# Patient Record
Sex: Female | Born: 1970 | Race: Black or African American | Hispanic: No | Marital: Single | Smoking: Current every day smoker
Health system: Southern US, Community
[De-identification: ages and names within clinical notes are randomized; demographics above are authoritative.]

## PROBLEM LIST (undated history)

## (undated) DIAGNOSIS — I10 Essential (primary) hypertension: Secondary | ICD-10-CM

---

## 2016-04-26 ENCOUNTER — Encounter (HOSPITAL_COMMUNITY): Payer: Self-pay | Admitting: Emergency Medicine

## 2016-04-26 DIAGNOSIS — R51 Headache: Secondary | ICD-10-CM | POA: Insufficient documentation

## 2016-04-26 DIAGNOSIS — I10 Essential (primary) hypertension: Secondary | ICD-10-CM | POA: Insufficient documentation

## 2016-04-26 DIAGNOSIS — F17210 Nicotine dependence, cigarettes, uncomplicated: Secondary | ICD-10-CM | POA: Insufficient documentation

## 2016-04-26 LAB — I-STAT CHEM 8, ED
BUN: 11 mg/dL (ref 6–20)
CALCIUM ION: 1.19 mmol/L (ref 1.15–1.40)
Chloride: 105 mmol/L (ref 101–111)
Creatinine, Ser: 0.9 mg/dL (ref 0.44–1.00)
Glucose, Bld: 88 mg/dL (ref 65–99)
HCT: 41 % (ref 36.0–46.0)
Hemoglobin: 13.9 g/dL (ref 12.0–15.0)
Potassium: 4.2 mmol/L (ref 3.5–5.1)
SODIUM: 139 mmol/L (ref 135–145)
TCO2: 22 mmol/L (ref 0–100)

## 2016-04-26 NOTE — ED Notes (Signed)
EDP notified of pt, VS, hx. No orders at this time.

## 2016-04-26 NOTE — ED Triage Notes (Signed)
Pt here with severe right sided headache and htn. Pt reports she is from [place] and has not been on her BP medication in 3 months. Pt sts she had to be hospitalized in the past for HTN. Pt a/o x 4, no neuro symptoms.

## 2016-04-27 ENCOUNTER — Emergency Department (HOSPITAL_COMMUNITY)
Admission: EM | Admit: 2016-04-27 | Discharge: 2016-04-27 | Disposition: A | Discharge status: Home / Self Care | Payer: Self-pay | Attending: Emergency Medicine | Admitting: Emergency Medicine

## 2016-04-27 DIAGNOSIS — R519 Headache, unspecified: Secondary | ICD-10-CM

## 2016-04-27 DIAGNOSIS — R51 Headache: Secondary | ICD-10-CM

## 2016-04-27 DIAGNOSIS — I10 Essential (primary) hypertension: Secondary | ICD-10-CM

## 2016-04-27 HISTORY — DX: Essential (primary) hypertension: I10

## 2016-04-27 MED ORDER — SODIUM CHLORIDE 0.9 % IV SOLN
1000.0000 mL | INTRAVENOUS | Status: DC
  Administered 2016-04-27: 1000 mL via INTRAVENOUS

## 2016-04-27 MED ORDER — HYDROCHLOROTHIAZIDE 25 MG PO TABS: 25.0000 mg | ORAL_TABLET | Freq: Every day | ORAL | Status: DC

## 2016-04-27 MED ORDER — MORPHINE SULFATE (PF) 4 MG/ML IV SOLN
4.0000 mg | Freq: Once | INTRAVENOUS | Status: AC
Start: 2016-04-27 — End: 2016-04-27
  Administered 2016-04-27: 4 mg via INTRAVENOUS
  Filled 2016-04-27: qty 1

## 2016-04-27 MED ORDER — LABETALOL HCL 5 MG/ML IV SOLN
20.0000 mg | Freq: Once | INTRAVENOUS | Status: AC
  Administered 2016-04-27: 20 mg via INTRAVENOUS
  Filled 2016-04-27: qty 4

## 2016-04-27 MED ORDER — KETOROLAC TROMETHAMINE 30 MG/ML IJ SOLN
30.0000 mg | Freq: Once | INTRAMUSCULAR | Status: AC
  Administered 2016-04-27: 30 mg via INTRAVENOUS
  Filled 2016-04-27: qty 1

## 2016-04-27 MED ORDER — HYDROCHLOROTHIAZIDE 25 MG PO TABS: 25.0000 mg | ORAL_TABLET | Freq: Every day | ORAL | 1 refills | Status: DC

## 2016-04-27 MED ORDER — SODIUM CHLORIDE 0.9 % IV SOLN
1000.0000 mL | Freq: Once | INTRAVENOUS | Status: AC
  Administered 2016-04-27: 1000 mL via INTRAVENOUS

## 2016-04-27 NOTE — ED Notes (Signed)
Pt arrives to [facility] at this time via [facility]. Pt states that she was on BP medications but she began "feeling better" and she "took" herself of the medications. Pt states that she has a left frontal headache rated 9/10 at this time.   Chief Complaint  Patient presents with  . Hypertension  . Headache   Past Medical History:  Diagnosis Date  . Hypertension

## 2016-04-27 NOTE — ED Notes (Signed)
Pt provided with d/c instructions at this time. Pt verbalizes understanding of d/c instructions as well as follow up procedure after d/c.  Pt provided with RX for HCTZ.  Pt verbalizes understanding of RX directions. Pt in no apparent distress at this time.  Pt ambulatory at time of d/c.

## 2016-04-27 NOTE — ED Provider Notes (Signed)
[facility] Provider Note   CSN: [id number] Arrival date & time: 04/26/16  2051  By signing my name below, I, Hannah Henry. Hannah Henry, attest that this documentation has been prepared under the direction and in the presence of Hannah Bilis, MD.  Electronically Signed: Suzan Henry. Hannah Henry, ED Scribe. 04/27/16. 1:48 AM.    History   Chief Complaint Chief Complaint  Patient presents with  . Hypertension  . Headache   The history is provided by the patient. No language interpreter was used.    HPI Comments: Hannah Henry is a 45 y.o. female with a PMHx of HTN who presents to the Emergency Department complaining of constant, recurrent, unchanged HA onset this afternoon. Pt states HA came on after alcohol consumption. No aggravating or alleviating factors at this time. Denies any associated symptoms. No OTC medications or home remedies attempted prior to arrival. No recent fever, chills, nausea, vomiting, shortness of breath, or chest pain. Pt admits to a strong history of HAs without known cause. She recently relocated from [place], [place] 3 months ago and has been off of her prescribed blood pressure medication for approximately 1 year now.  PCP: No primary care provider on file.    Past Medical History:  Diagnosis Date  . Hypertension     There are no active problems to display for this patient.   History reviewed. No pertinent surgical history.  OB History    No data available       Home Medications    Prior to Admission medications   Not on File    Family History History reviewed. No pertinent family history.  Social History Social History  Substance Use Topics  . Smoking status: Current Every Day Smoker    Packs/day: 0.50    Types: Cigarettes  . Smokeless tobacco: Never Used  . Alcohol use Yes     Comment: daily- last drink 9/7 at 1700     Allergies   Review of patient's allergies indicates no known allergies.   Review of Systems Review of Systems    Constitutional: Negative for chills and fever.  Gastrointestinal: Negative for nausea and vomiting.  Neurological: Positive for headaches.  All other systems reviewed and are negative.    Physical Exam Updated Vital Signs BP (!) 204/141   Pulse 79   Temp 98.4 F (36.9 C)   Resp 18   Ht 5\' 2"  (1.575 m)   Wt 178 lb (80.7 kg)   LMP 04/19/2016 (Approximate)   SpO2 100%   BMI 32.56 kg/m   Physical Exam  Constitutional: She is oriented to person, place, and time. She appears well-developed and well-nourished. No distress.  HENT:  Head: Normocephalic and atraumatic.  Eyes: EOM are normal. Pupils are equal, round, and reactive to light.  Neck: Normal range of motion.  Cardiovascular: Normal rate, regular rhythm and normal heart sounds.   Pulmonary/Chest: Effort normal and breath sounds normal.  Abdominal: Soft. She exhibits no distension. There is no tenderness.  Musculoskeletal: Normal range of motion.  Neurological: She is alert and oriented to person, place, and time.  5/5 strength in major muscle groups of  bilateral upper and lower extremities. Speech normal. No facial asymetry.   Skin: Skin is warm and dry.  Psychiatric: She has a normal mood and affect. Judgment normal.  Nursing note and vitals reviewed.    ED Treatments / Results   DIAGNOSTIC STUDIES: Oxygen Saturation is 100% on RA, Normal by my interpretation.    COORDINATION OF CARE:  1:46 AM- Will give Hydrodiuril, Morphine,Toradol, and fluids.Will order blood work.  Discussed treatment plan with pt at bedside and pt agreed to plan.     Labs (all labs ordered are listed, but only abnormal results are displayed) Labs Reviewed  I-STAT CHEM 8, ED    EKG  EKG Interpretation None       Radiology No results found.  Procedures Procedures (including critical care time)  Medications Ordered in ED Medications - No data to display   Initial Impression / Assessment and Plan / ED Course  I have reviewed  the triage vital signs and the nursing notes.  Pertinent labs & imaging results that were available during my care of the patient were reviewed by me and considered in my medical decision making (see chart for details).  Clinical Course   3:57 AM Patient feels much better at this time.  Nonfocal neurologic exam.  Headache improved.  Blood pressure improved.  Patient will need follow-up with primary care physician.  Patient written a prescription for hydrochlorothiazide.  Final Clinical Impressions(s) / ED Diagnoses   Final diagnoses:  Essential hypertension  Headache, unspecified headache type    New Prescriptions New Prescriptions   HYDROCHLOROTHIAZIDE (HYDRODIURIL) 25 MG TABLET    Take 1 tablet (25 mg total) by mouth daily.   I personally performed the services described in this documentation, which was scribed in my presence. The recorded information has been reviewed and is accurate.        Hannah BilisKevin Janus Vlcek, MD 04/27/16 [phone]

## 2016-04-27 NOTE — ED Notes (Signed)
Family at bedside. 

## 2016-05-13 ENCOUNTER — Emergency Department (HOSPITAL_COMMUNITY): Payer: Self-pay

## 2016-05-13 ENCOUNTER — Encounter (HOSPITAL_COMMUNITY): Payer: Self-pay | Admitting: Emergency Medicine

## 2016-05-13 ENCOUNTER — Emergency Department (HOSPITAL_COMMUNITY)
Admission: EM | Admit: 2016-05-13 | Discharge: 2016-05-14 | Disposition: A | Discharge status: Home / Self Care | Payer: Self-pay | Attending: Emergency Medicine | Admitting: Emergency Medicine

## 2016-05-13 DIAGNOSIS — N926 Irregular menstruation, unspecified: Secondary | ICD-10-CM | POA: Insufficient documentation

## 2016-05-13 DIAGNOSIS — N910 Primary amenorrhea: Secondary | ICD-10-CM

## 2016-05-13 DIAGNOSIS — R109 Unspecified abdominal pain: Secondary | ICD-10-CM

## 2016-05-13 DIAGNOSIS — F17210 Nicotine dependence, cigarettes, uncomplicated: Secondary | ICD-10-CM | POA: Insufficient documentation

## 2016-05-13 DIAGNOSIS — G44219 Episodic tension-type headache, not intractable: Secondary | ICD-10-CM | POA: Insufficient documentation

## 2016-05-13 DIAGNOSIS — Z8679 Personal history of other diseases of the circulatory system: Secondary | ICD-10-CM | POA: Insufficient documentation

## 2016-05-13 DIAGNOSIS — R102 Pelvic and perineal pain: Secondary | ICD-10-CM | POA: Insufficient documentation

## 2016-05-13 DIAGNOSIS — I159 Secondary hypertension, unspecified: Secondary | ICD-10-CM | POA: Insufficient documentation

## 2016-05-13 LAB — CBC
HCT: 37.3 % (ref 36.0–46.0)
Hemoglobin: 12.2 g/dL (ref 12.0–15.0)
MCH: 27.1 pg (ref 26.0–34.0)
MCHC: 32.7 g/dL (ref 30.0–36.0)
MCV: 82.9 fL (ref 78.0–100.0)
Platelets: 352 K/uL (ref 150–400)
RBC: 4.5 MIL/uL (ref 3.87–5.11)
RDW: 12.6 % (ref 11.5–15.5)
WBC: 5 K/uL (ref 4.0–10.5)

## 2016-05-13 LAB — COMPREHENSIVE METABOLIC PANEL WITH GFR
ALT: 17 U/L (ref 14–54)
AST: 18 U/L (ref 15–41)
Albumin: 3.4 g/dL — ABNORMAL LOW (ref 3.5–5.0)
Alkaline Phosphatase: 60 U/L (ref 38–126)
Anion gap: 9 (ref 5–15)
BUN: 14 mg/dL (ref 6–20)
CO2: 19 mmol/L — ABNORMAL LOW (ref 22–32)
Calcium: 8.6 mg/dL — ABNORMAL LOW (ref 8.9–10.3)
Chloride: 105 mmol/L (ref 101–111)
Creatinine, Ser: 0.79 mg/dL (ref 0.44–1.00)
GFR calc Af Amer: >60 mL/min
GFR calc non Af Amer: >60 mL/min
Glucose, Bld: 84 mg/dL (ref 65–99)
Potassium: 3.8 mmol/L (ref 3.5–5.1)
Sodium: 133 mmol/L — ABNORMAL LOW (ref 135–145)
Total Bilirubin: 0.5 mg/dL (ref 0.3–1.2)
Total Protein: 6.8 g/dL (ref 6.5–8.1)

## 2016-05-13 LAB — URINALYSIS, ROUTINE W REFLEX MICROSCOPIC
BILIRUBIN URINE: NEGATIVE
GLUCOSE, UA: NEGATIVE mg/dL
Ketones, ur: NEGATIVE mg/dL
Nitrite: NEGATIVE
PH: 5 (ref 5.0–8.0)
Protein, ur: NEGATIVE mg/dL
SPECIFIC GRAVITY, URINE: 1.02 (ref 1.005–1.030)

## 2016-05-13 LAB — LIPASE, BLOOD: Lipase: 42 U/L (ref 11–51)

## 2016-05-13 LAB — URINE MICROSCOPIC-ADD ON

## 2016-05-13 LAB — I-STAT BETA HCG BLOOD, ED (MC, WL, AP ONLY): I-stat hCG, quantitative: <5 m[IU]/mL

## 2016-05-13 MED ORDER — HYDRALAZINE HCL 20 MG/ML IJ SOLN: 10.0000 mg | Freq: Once | INTRAMUSCULAR | Status: DC

## 2016-05-13 MED ORDER — PROCHLORPERAZINE EDISYLATE 5 MG/ML IJ SOLN
10.0000 mg | Freq: Once | INTRAMUSCULAR | Status: AC
  Administered 2016-05-13: 10 mg via INTRAVENOUS
  Filled 2016-05-13: qty 2

## 2016-05-13 MED ORDER — MAGNESIUM SULFATE 2 GM/50ML IV SOLN
2.0000 g | Freq: Once | INTRAVENOUS | Status: AC
  Administered 2016-05-13: 2 g via INTRAVENOUS
  Filled 2016-05-13: qty 50

## 2016-05-13 NOTE — ED Provider Notes (Signed)
[facility] Provider Note   CSN: [id number] Arrival date & time: 05/13/16  2007     History   Chief Complaint Chief Complaint  Patient presents with  . Headache  . Abdominal Cramping    HPI Hannah Henry is a 45 y.o. female.  The history is provided by the patient.  Headache   This is a recurrent problem. Episode onset: 1 yr. Associated with: HTN. The pain is moderate. Associated symptoms include nausea. Pertinent negatives include no fever, no palpitations, no shortness of breath and no vomiting.  Abdominal Cramping  This is a new problem. The current episode started more than 1 week ago. The problem occurs daily (intermittent). The problem has been gradually improving. Associated symptoms include abdominal pain and headaches. Pertinent negatives include no chest pain and no shortness of breath. Nothing aggravates the symptoms.   Patient reports being late this month with her menstrual periods however noted that she started spotting this afternoon.  Last sexual encounter was 3 months ago.   Past Medical History:  Diagnosis Date  . Hypertension     There are no active problems to display for this patient.   History reviewed. No pertinent surgical history.  OB History    No data available       Home Medications    Prior to Admission medications   Medication Sig Start Date End Date Taking? Authorizing Provider  ibuprofen (ADVIL,MOTRIN) 200 MG tablet Take 400-800 mg by mouth every 4 (four) hours as needed for headache or moderate pain.    Yes Historical Provider, MD  hydrochlorothiazide (HYDRODIURIL) 25 MG tablet Take 1 tablet (25 mg total) by mouth 2 (two) times daily. 05/14/16   Nira Conn, MD    Family History History reviewed. No pertinent family history.  Social History Social History  Substance Use Topics  . Smoking status: Current Every Day Smoker    Packs/day: 0.50    Types: Cigarettes  . Smokeless tobacco: Never Used  . Alcohol  use Yes     Comment: daily- last drink 9/7 at 1700     Allergies   Review of patient's allergies indicates no known allergies.   Review of Systems Review of Systems  Constitutional: Negative for chills and fever.  HENT: Negative for ear pain and sore throat.   Eyes: Negative for pain and visual disturbance.  Respiratory: Negative for cough and shortness of breath.   Cardiovascular: Negative for chest pain and palpitations.  Gastrointestinal: Positive for abdominal pain and nausea. Negative for vomiting.  Genitourinary: Positive for vaginal discharge. Negative for dysuria and hematuria.  Musculoskeletal: Negative for arthralgias and back pain.  Skin: Negative for color change and rash.  Neurological: Positive for headaches. Negative for seizures and syncope.  All other systems reviewed and are negative.    Physical Exam Updated Vital Signs BP (!) 176/121 (BP Location: Right Arm)   Pulse 71   Temp 98.1 F (36.7 C) (Oral)   Resp 25   LMP 04/19/2016 (Approximate)   SpO2 100%   Physical Exam  Constitutional: She is oriented to person, place, and time. She appears well-developed and well-nourished. No distress.  HENT:  Head: Normocephalic and atraumatic.  Nose: Nose normal.  Eyes: Conjunctivae and EOM are normal. Pupils are equal, round, and reactive to light. Right eye exhibits no discharge. Left eye exhibits no discharge. No scleral icterus.  Fundoscopic exam:      The right eye shows no papilledema.       The left  eye shows no papilledema.  Neck: Normal range of motion. Neck supple.  Cardiovascular: Normal rate and regular rhythm.  Exam reveals no gallop and no friction rub.   No murmur heard. Pulmonary/Chest: Effort normal and breath sounds normal. No stridor. No respiratory distress. She has no rales.  Abdominal: Soft. She exhibits no distension. There is no tenderness.  Musculoskeletal: She exhibits no edema or tenderness.  Neurological: She is alert and oriented to  person, place, and time.  Mental Status: Alert and oriented to person, place, and time. Attention and concentration normal. Speech clear. Recent memory is intac  Cranial Nerves  II Visual Fields: Intact to confrontation. Visual fields intact. III, IV, VI: Pupils equal and reactive to light and near. Full eye movement without nystagmus  V Facial Sensation: Normal. No weakness of masticatory muscles  VII: No facial weakness or asymmetry  VIII Auditory Acuity: Grossly normal  IX/X: The uvula is midline; the palate elevates symmetrically  XI: Normal sternocleidomastoid and trapezius strength  XII: The tongue is midline. No atrophy or fasciculations.   Motor System: Muscle Strength: 5/5 and symmetric in the upper and lower extremities. No pronation or drift.  Muscle Tone: Tone and muscle bulk are normal in the upper and lower extremities.   Reflexes: DTRs: 2+ and symmetrical in all four extremities. Plantar responses are flexor bilaterally.  Coordination: Intact finger-to-nose. No tremor.  Sensation: Intact to light touch, and pinprick.  Gait: Routine gait normal   Skin: Skin is warm and dry. No rash noted. She is not diaphoretic. No erythema.  Psychiatric: She has a normal mood and affect.  Vitals reviewed.    ED Treatments / Results  Labs (all labs ordered are listed, but only abnormal results are displayed) Labs Reviewed  WET PREP, GENITAL - Abnormal; Notable for the following:       Result Value   Clue Cells Wet Prep HPF POC PRESENT (*)    WBC, Wet Prep HPF POC MANY (*)    All other components within normal limits  COMPREHENSIVE METABOLIC PANEL - Abnormal; Notable for the following:    Sodium 133 (*)    CO2 19 (*)    Calcium 8.6 (*)    Albumin 3.4 (*)    All other components within normal limits  URINALYSIS, ROUTINE W REFLEX MICROSCOPIC (NOT AT [facility]) - Abnormal; Notable for the following:    Hgb urine dipstick LARGE (*)    Leukocytes, UA SMALL (*)    All other components  within normal limits  URINE MICROSCOPIC-ADD ON - Abnormal; Notable for the following:    Squamous Epithelial / LPF 6-30 (*)    Bacteria, UA MANY (*)    All other components within normal limits  LIPASE, BLOOD  CBC  I-STAT BETA HCG BLOOD, ED ([facility], [facility], [facility] ONLY)  GC/CHLAMYDIA PROBE AMP ([place]) NOT AT [facility]    EKG  EKG Interpretation None       Radiology Ct Head Wo Contrast  Result Date: 05/14/2016 CLINICAL DATA:  45 year old female with headache. History of subdural hemorrhage. EXAM: CT HEAD WITHOUT CONTRAST TECHNIQUE: Contiguous axial images were obtained from the base of the skull through the vertex without intravenous contrast. COMPARISON:  None. FINDINGS: Brain: The ventricles and sulci are appropriate size for the patient's age. The great white matter discrimination is preserved. There is no acute intracranial hemorrhage. No mass effect or midline shift noted. No extra-axial fluid collection. Vascular: No hyperdense vessel or unexpected calcification. Skull: Normal. Negative for fracture or  focal lesion. Sinuses/Orbits: No acute finding. Other: None IMPRESSION: No acute intracranial pathology. Electronically Signed   By: Elgie Collard M.D.   On: 05/14/2016 00:12    Procedures Procedures (including critical care time)  Medications Ordered in ED Medications  hydrALAZINE (APRESOLINE) injection 10 mg ( Intravenous Canceled Entry 05/13/16 2339)  magnesium sulfate IVPB 2 g 50 mL (0 g Intravenous Stopped 05/13/16 2339)  prochlorperazine (COMPAZINE) injection 10 mg (10 mg Intravenous Given 05/13/16 2216)     Initial Impression / Assessment and Plan / ED Course  I have reviewed the triage vital signs and the nursing notes.  Pertinent labs & imaging results that were available during my care of the patient were reviewed by me and considered in my medical decision making (see chart for details).  Clinical Course    1. Headache Tension-like headache, similar to prior which is  likely secondary to chronic hypertension. However patient does report previous "blood in my head" in March. Given magnesium and Compazine for headache. Hydralazine for blood pressure. CT head negative.  Complete resolution of patient's headache following treatment.  2. Pelvic cramping Associated vaginal discharge. Pelvic exam Without evidence of cervicitis or PID. Wet prep with positive clue cells. GC chlamydia pending. Given the lack of tenderness or discharge on pelvic exam, do not feel that patient should be treated for bacterial vaginosis at this time. Since the pain is improving this is likely secondary to menstrual cramping.   Patient is appropriate for discharge with strict return precautions.  Given her poorly controlled hypertension on HCTZ 25 mg we'll increase her dose to twice a day. Patient already has follow-up with her primary care doctor in 2 weeks. She was instructed to keep this appointment for continued management of her hypertension.  Final Clinical Impressions(s) / ED Diagnoses   Final diagnoses:  Episodic tension-type headache, not intractable  Secondary hypertension, unspecified  Abdominal cramping  Delayed menstruation   Disposition: Discharge  Condition: Good  I have discussed the results, Dx and Tx plan with the patient who expressed understanding and agree(s) with the plan. Discharge instructions discussed at great length. The patient was given strict return precautions who verbalized understanding of the instructions. No further questions at time of discharge.    Discharge Medication List as of 05/14/2016 12:15 AM      Follow Up: Primary care provider  In 2 weeks as scheduled      Nira Conn, MD 05/14/16 [phone]

## 2016-05-13 NOTE — ED Triage Notes (Signed)
Abdominal cramping for three weeks; late on menstrual cycle. Feeling nauseated for three weeks also. Reports headache that started today, unrelieved by Aleve.

## 2016-05-14 LAB — WET PREP, GENITAL
Sperm: NONE SEEN
Trich, Wet Prep: NONE SEEN
YEAST WET PREP: NONE SEEN

## 2016-05-14 LAB — GC/CHLAMYDIA PROBE AMP (~~LOC~~) NOT AT ARMC
CHLAMYDIA, DNA PROBE: NEGATIVE
NEISSERIA GONORRHEA: NEGATIVE

## 2016-05-14 MED ORDER — HYDROCHLOROTHIAZIDE 25 MG PO TABS: 25.0000 mg | ORAL_TABLET | Freq: Two times a day (BID) | ORAL | 1 refills | Status: DC

## 2016-05-30 ENCOUNTER — Ambulatory Visit: Payer: Self-pay | Admitting: Internal Medicine

## 2016-07-16 ENCOUNTER — Ambulatory Visit: Payer: Self-pay

## 2016-09-10 ENCOUNTER — Emergency Department (HOSPITAL_COMMUNITY): Payer: Self-pay

## 2016-09-10 ENCOUNTER — Encounter (HOSPITAL_COMMUNITY): Payer: Self-pay | Admitting: Emergency Medicine

## 2016-09-10 ENCOUNTER — Emergency Department (HOSPITAL_COMMUNITY)
Admission: EM | Admit: 2016-09-10 | Discharge: 2016-09-10 | Disposition: A | Discharge status: Home / Self Care | Payer: Self-pay | Attending: Emergency Medicine | Admitting: Emergency Medicine

## 2016-09-10 DIAGNOSIS — F17210 Nicotine dependence, cigarettes, uncomplicated: Secondary | ICD-10-CM | POA: Insufficient documentation

## 2016-09-10 DIAGNOSIS — Z79899 Other long term (current) drug therapy: Secondary | ICD-10-CM | POA: Insufficient documentation

## 2016-09-10 DIAGNOSIS — I10 Essential (primary) hypertension: Secondary | ICD-10-CM | POA: Insufficient documentation

## 2016-09-10 DIAGNOSIS — R51 Headache: Secondary | ICD-10-CM | POA: Insufficient documentation

## 2016-09-10 DIAGNOSIS — R519 Headache, unspecified: Secondary | ICD-10-CM

## 2016-09-10 LAB — CBC WITH DIFFERENTIAL/PLATELET
Basophils Absolute: 0 10*3/uL (ref 0.0–0.1)
Basophils Relative: 1 %
EOS ABS: 0.1 10*3/uL (ref 0.0–0.7)
EOS PCT: 1 %
HCT: 39.4 % (ref 36.0–46.0)
Hemoglobin: 13.1 g/dL (ref 12.0–15.0)
LYMPHS ABS: 1.8 10*3/uL (ref 0.7–4.0)
LYMPHS PCT: 42 %
MCH: 27 pg (ref 26.0–34.0)
MCHC: 33.2 g/dL (ref 30.0–36.0)
MCV: 81.1 fL (ref 78.0–100.0)
MONO ABS: 0.2 10*3/uL (ref 0.1–1.0)
Monocytes Relative: 5 %
Neutro Abs: 2.3 10*3/uL (ref 1.7–7.7)
Neutrophils Relative %: 51 %
PLATELETS: 309 10*3/uL (ref 150–400)
RBC: 4.86 MIL/uL (ref 3.87–5.11)
RDW: 13.2 % (ref 11.5–15.5)
WBC: 4.4 10*3/uL (ref 4.0–10.5)

## 2016-09-10 LAB — I-STAT CHEM 8, ED
BUN: 19 mg/dL (ref 6–20)
CALCIUM ION: 1.2 mmol/L (ref 1.15–1.40)
CHLORIDE: 101 mmol/L (ref 101–111)
CREATININE: 0.9 mg/dL (ref 0.44–1.00)
GLUCOSE: 111 mg/dL — AB (ref 65–99)
HCT: 41 % (ref 36.0–46.0)
Hemoglobin: 13.9 g/dL (ref 12.0–15.0)
POTASSIUM: 3.6 mmol/L (ref 3.5–5.1)
Sodium: 136 mmol/L (ref 135–145)
TCO2: 25 mmol/L (ref 0–100)

## 2016-09-10 LAB — BASIC METABOLIC PANEL
Anion gap: 9 (ref 5–15)
BUN: 17 mg/dL (ref 6–20)
CALCIUM: 9.1 mg/dL (ref 8.9–10.3)
CO2: 24 mmol/L (ref 22–32)
CREATININE: 0.94 mg/dL (ref 0.44–1.00)
Chloride: 101 mmol/L (ref 101–111)
GFR calc Af Amer: >60 mL/min (ref >60)
GLUCOSE: 104 mg/dL — AB (ref 65–99)
Potassium: 3.5 mmol/L (ref 3.5–5.1)
SODIUM: 134 mmol/L — AB (ref 135–145)

## 2016-09-10 MED ORDER — OXYCODONE-ACETAMINOPHEN 5-325 MG PO TABS
1.0000 | ORAL_TABLET | ORAL | Status: DC | PRN
  Administered 2016-09-10: 1 via ORAL

## 2016-09-10 MED ORDER — HYDROCHLOROTHIAZIDE 25 MG PO TABS
25.0000 mg | ORAL_TABLET | Freq: Once | ORAL | Status: AC
  Administered 2016-09-10: 25 mg via ORAL
  Filled 2016-09-10: qty 1

## 2016-09-10 MED ORDER — OXYCODONE-ACETAMINOPHEN 5-325 MG PO TABS
ORAL_TABLET | ORAL | Status: AC
  Filled 2016-09-10: qty 1

## 2016-09-10 MED ORDER — METOCLOPRAMIDE HCL 5 MG/ML IJ SOLN
10.0000 mg | Freq: Once | INTRAMUSCULAR | Status: AC
  Administered 2016-09-10: 10 mg via INTRAVENOUS
  Filled 2016-09-10: qty 2

## 2016-09-10 MED ORDER — DIPHENHYDRAMINE HCL 50 MG/ML IJ SOLN
25.0000 mg | Freq: Once | INTRAMUSCULAR | Status: AC
  Administered 2016-09-10: 25 mg via INTRAVENOUS
  Filled 2016-09-10: qty 1

## 2016-09-10 MED ORDER — HYDROCHLOROTHIAZIDE 25 MG PO TABS: 25.0000 mg | ORAL_TABLET | Freq: Two times a day (BID) | ORAL | 0 refills | Status: AC

## 2016-09-10 NOTE — ED Notes (Signed)
Pt verbalized understanding of d/c instructions. Ambulatory to [facility], NAD

## 2016-09-10 NOTE — ED Notes (Signed)
EDP at the bedside.  ?

## 2016-09-10 NOTE — ED Notes (Signed)
Spoke with Dr Fayrene FearingJames patient able to eating while taking pain medication.

## 2016-09-10 NOTE — ED Provider Notes (Signed)
[facility] Provider Note   CSN: [id number] Arrival date & time: 09/10/16  1109     History   Chief Complaint Chief Complaint  Patient presents with  . Headache    HPI Hannah Henry is a 46 y.o. female hx of HTN, previous alcohol abuse here with headaches. Patient has been having headaches for the last 3-4 months. Patient was seen in the ED in September for headaches and hypertension and CT head and labs were unremarkable. Patient ran out her blood pressure meds for the last several months, and her headaches has worsened. Has been taking motrin with minimal relief. She states that she is from [place] and had been admitted for malignant hypertension previously. Denies chest pain or abdominal pain or numbness or weakness.   The history is provided by the patient.    Past Medical History:  Diagnosis Date  . Hypertension     There are no active problems to display for this patient.   History reviewed. No pertinent surgical history.  OB History    No data available       Home Medications    Prior to Admission medications   Medication Sig Start Date End Date Taking? Authorizing Provider  hydrochlorothiazide (HYDRODIURIL) 25 MG tablet Take 1 tablet (25 mg total) by mouth 2 (two) times daily. 05/14/16   Nira Conn, MD  ibuprofen (ADVIL,MOTRIN) 200 MG tablet Take 400-800 mg by mouth every 4 (four) hours as needed for headache or moderate pain.     Historical Provider, MD    Family History History reviewed. No pertinent family history.  Social History Social History  Substance Use Topics  . Smoking status: Current Every Day Smoker    Packs/day: 0.50    Types: Cigarettes  . Smokeless tobacco: Never Used  . Alcohol use Yes     Comment: daily- last drink 9/7 at 1700     Allergies   Patient has no known allergies.   Review of Systems Review of Systems  Neurological: Positive for headaches.  All other systems reviewed and are  negative.    Physical Exam Updated Vital Signs BP (!) 151/108 (BP Location: Right Arm)   Pulse 68   Temp 97.7 F (36.5 C) (Oral)   Resp 16   Ht 5\' 2"  (1.575 m)   Wt 180 lb (81.6 kg)   SpO2 100%   BMI 32.92 kg/m   Physical Exam  Constitutional: She is oriented to person, place, and time. She appears well-developed.  Slightly uncomfortable   HENT:  Head: Normocephalic.  Mouth/Throat: Oropharynx is clear and moist.  Eyes: EOM are normal. Pupils are equal, round, and reactive to light.  Neck: Normal range of motion. Neck supple.  Cardiovascular: Normal rate, regular rhythm and normal heart sounds.   Pulmonary/Chest: Effort normal and breath sounds normal. No respiratory distress. She has no wheezes. She has no rales.  Abdominal: Soft. Bowel sounds are normal. She exhibits no distension. There is no tenderness.  Musculoskeletal: Normal range of motion.  Neurological: She is alert and oriented to person, place, and time. No cranial nerve deficit. Coordination normal.  Skin: Skin is warm.  Psychiatric: She has a normal mood and affect.  Nursing note and vitals reviewed.    ED Treatments / Results  Labs (all labs ordered are listed, but only abnormal results are displayed) Labs Reviewed  BASIC METABOLIC PANEL - Abnormal; Notable for the following:       Result Value   Sodium 134 (*)  Glucose, Bld 104 (*)    All other components within normal limits  I-STAT CHEM 8, ED - Abnormal; Notable for the following:    Glucose, Bld 111 (*)    All other components within normal limits  CBC WITH DIFFERENTIAL/PLATELET    EKG  EKG Interpretation None       Radiology Ct Head Wo Contrast  Result Date: 09/10/2016 CLINICAL DATA:  Chronic headache for 1 month, after syncope and hitting head on ground. Initial encounter. EXAM: CT HEAD WITHOUT CONTRAST TECHNIQUE: Contiguous axial images were obtained from the base of the skull through the vertex without intravenous contrast.  COMPARISON:  CT of the head performed 05/13/2016 FINDINGS: Brain: No evidence of acute infarction, hemorrhage, hydrocephalus, extra-axial collection or mass lesion/mass effect. The posterior fossa, including the cerebellum, brainstem and fourth ventricle, is within normal limits. The third and lateral ventricles, and basal ganglia are unremarkable in appearance. The cerebral hemispheres are symmetric in appearance, with normal gray-white differentiation. No mass effect or midline shift is seen. Vascular: No hyperdense vessel or unexpected calcification. Skull: There is no evidence of fracture; visualized osseous structures are unremarkable in appearance. Sinuses/Orbits: The orbits are within normal limits. The paranasal sinuses and mastoid air cells are well-aerated. Other: No significant soft tissue abnormalities are seen. IMPRESSION: Unremarkable noncontrast CT of the head. No evidence of traumatic intracranial injury or fracture. Electronically Signed   By: Roanna RaiderJeffery  Chang M.D.   On: 09/10/2016 17:17    Procedures Procedures (including critical care time)  Medications Ordered in ED Medications  oxyCODONE-acetaminophen (PERCOCET/ROXICET) 5-325 MG per tablet 1 tablet (1 tablet Oral Given 09/10/16 1544)  oxyCODONE-acetaminophen (PERCOCET/ROXICET) 5-325 MG per tablet (not administered)  metoCLOPramide (REGLAN) injection 10 mg (10 mg Intravenous Given 09/10/16 1659)  diphenhydrAMINE (BENADRYL) injection 25 mg (25 mg Intravenous Given 09/10/16 1659)  hydrochlorothiazide (HYDRODIURIL) tablet 25 mg (25 mg Oral Given 09/10/16 1700)     Initial Impression / Assessment and Plan / ED Course  I have reviewed the triage vital signs and the nursing notes.  Pertinent labs & imaging results that were available during my care of the patient were reviewed by me and considered in my medical decision making (see chart for details).    [name] is a 46 y.o. female here with headaches, hypertension. Likely  symptomatic hypertension. BP 140-150s in the ED. Nonfocal neuro exam. Will get labs, CT head. Will give migraine cocktail. Patient was on HCTZ before and will restart it.   6:46 PM CT head unremarkable. Labs at baseline. Given HCTZ 25 mg. BP 150/100. She states that she is going back to [place] in 2 weeks and can follow up there. Case management saw patient as well to establish follow up.    Final Clinical Impressions(s) / ED Diagnoses   Final diagnoses:  None    New Prescriptions New Prescriptions   No medications on file     Charlynne Panderavid Hsienta Yao, MD 09/10/16 [phone]

## 2016-09-10 NOTE — Discharge Instructions (Signed)
Continue tylenol, motrin for headaches.   Take HCTZ 25 mg daily.   [facility]   Return to ER if you have worse headaches, chest pain, vomiting, weakness, trouble speaking.

## 2016-09-10 NOTE — ED Notes (Signed)
Patient transported to CT 

## 2016-09-10 NOTE — ED Triage Notes (Signed)
Pt sts HA intermittently most likely due to htn per pt; pt sts needs medication adjustment

## 2017-02-10 IMAGING — CT CT HEAD W/O CM
4 of 5 series · 15 of 47 positions shown, 17 images · non-contrast
Comparison: CT of the head performed 05/13/2016

CLINICAL DATA: Chronic headache for 1 month, after syncope and
hitting head on ground. Initial encounter.

EXAM:
CT HEAD WITHOUT CONTRAST
TECHNIQUE: Contiguous axial images were obtained from the base of the skull
through the vertex without intravenous contrast.

[Series 2: head without · axial · non-contrast · 0.47mm/px · z∈[-25,+75]mm · 7 of 28 slices shown, 9 images]
[im 4/28  brain]
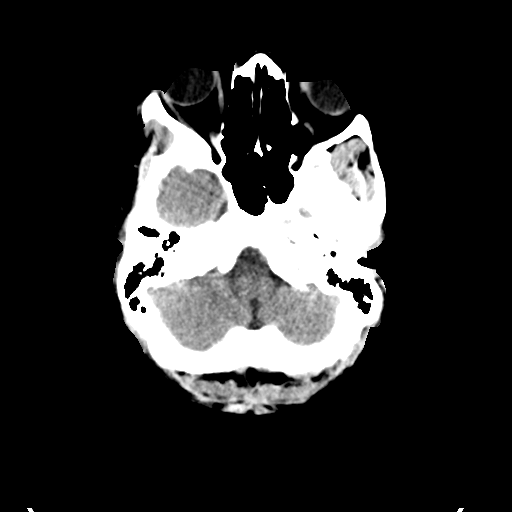
[im 4/28  bone]
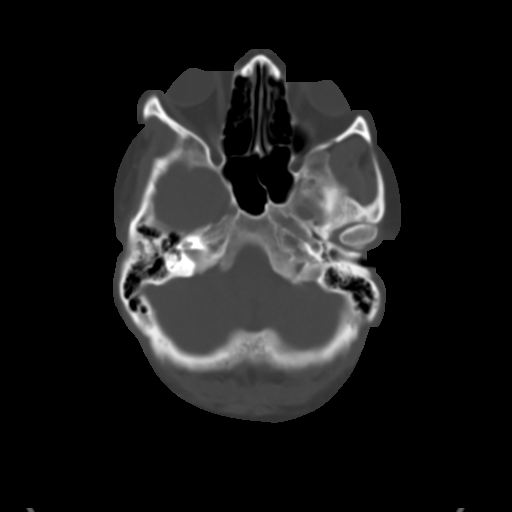
[im 7/28  brain]
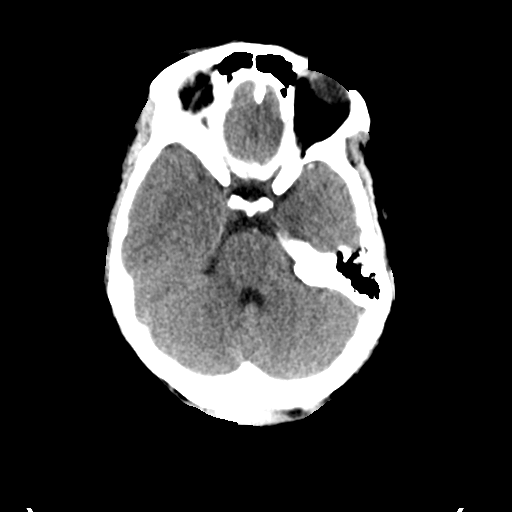
[im 11/28  brain]
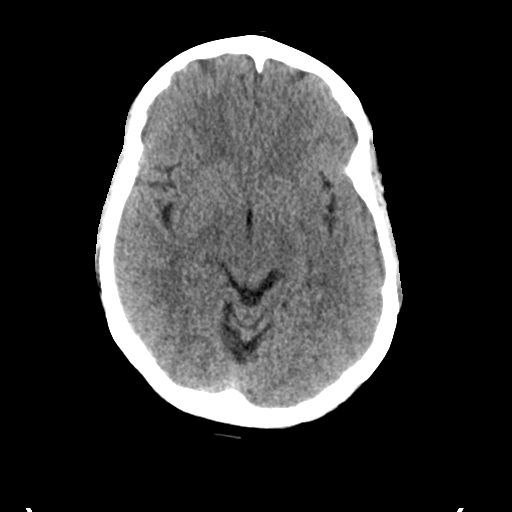
[im 14/28  brain]
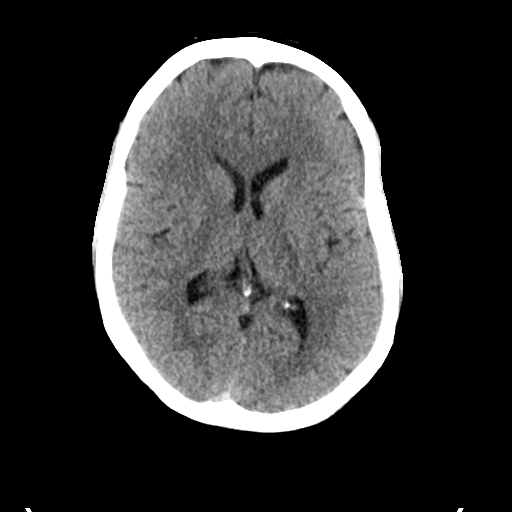
[im 17/28  brain]
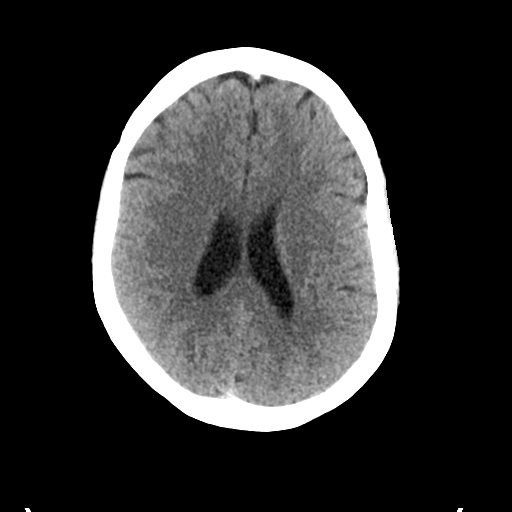
[im 17/28  bone]
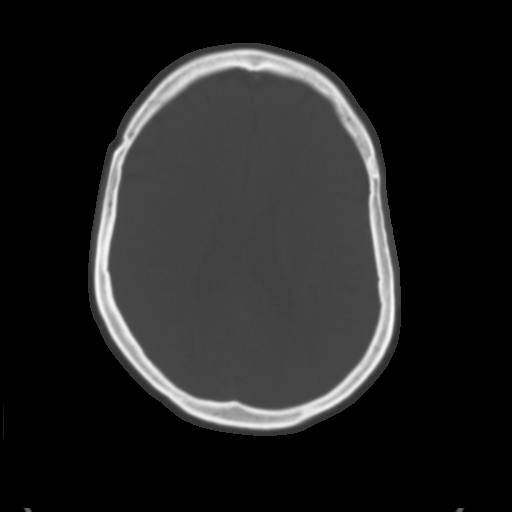
[im 21/28  brain]
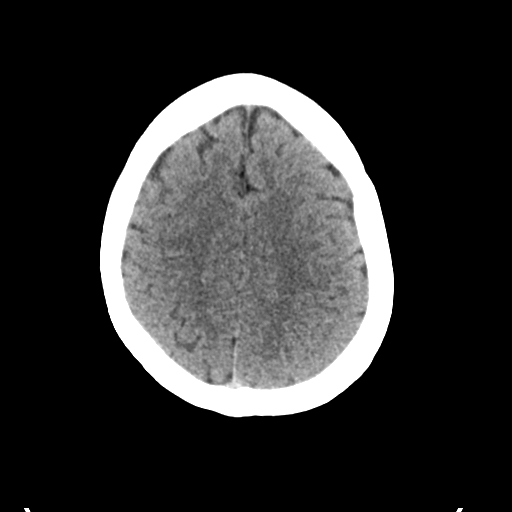
[im 24/28  brain]
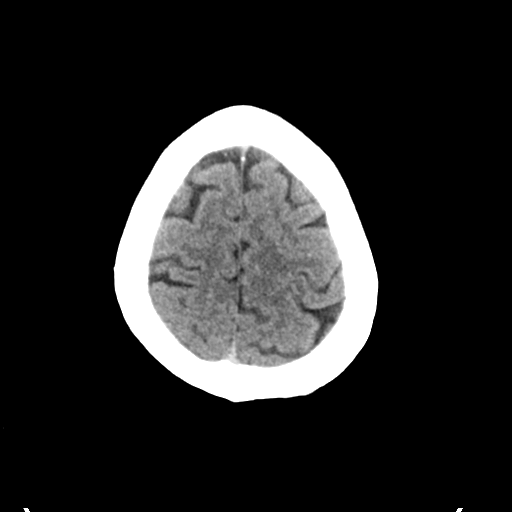

[Series 3: head bone · axial · 0.47mm/px · z∈[-26,-12]mm · 2 of 68 slices shown]
[im 8/68  bone]
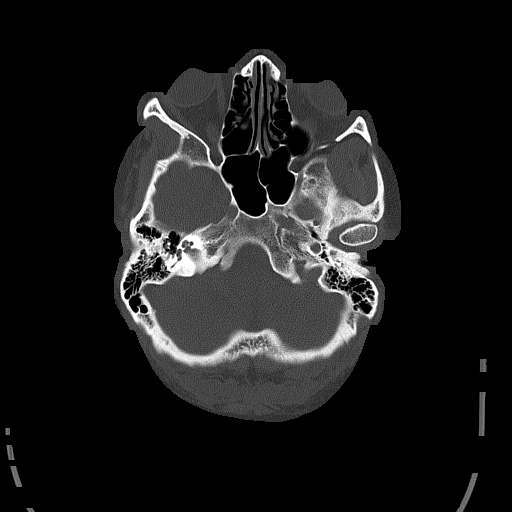
[im 15/68  bone]
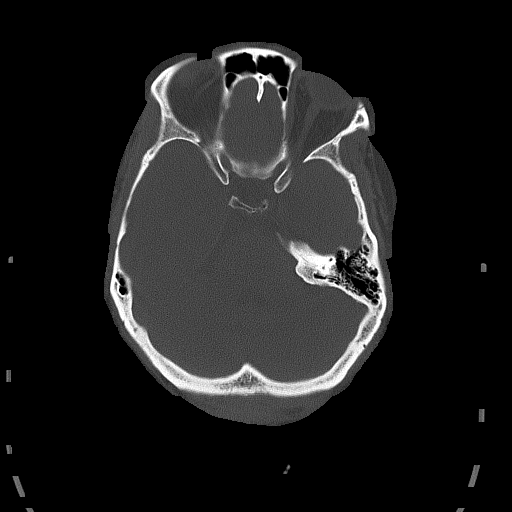

[Series 4: head without cor · coronal · non-contrast · 0.30mm/px · 3 of 71 slices shown]
[im 24/71  brain]
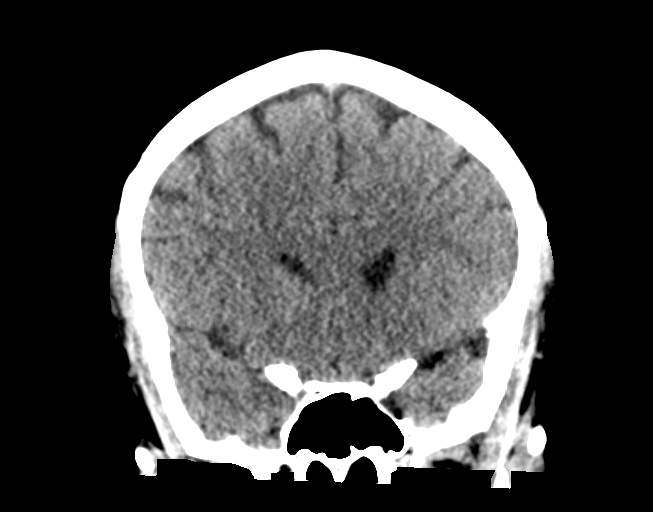
[im 32/71  brain]
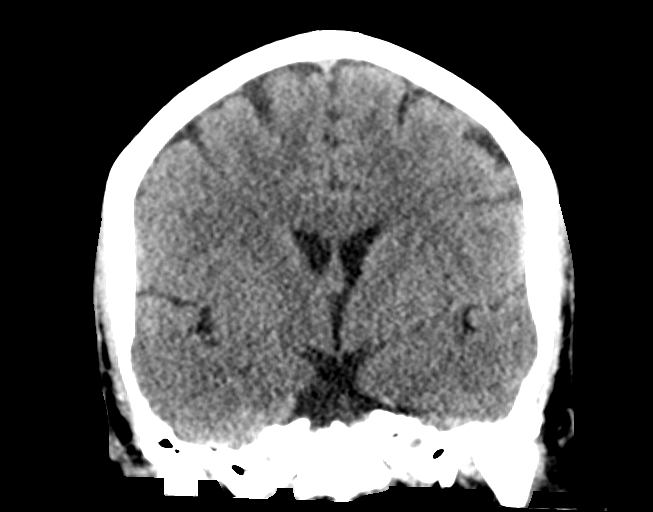
[im 39/71  brain]
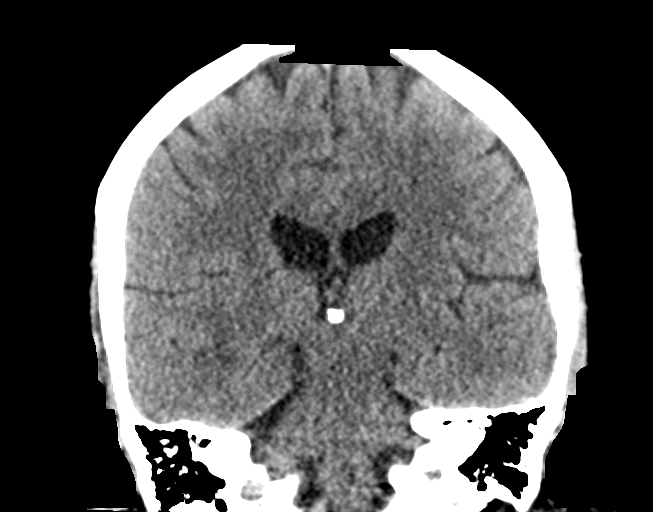

[Series 5: head without sag · sagittal · non-contrast · 0.33mm/px · 3 of 67 slices shown]
[im 23/67  brain]
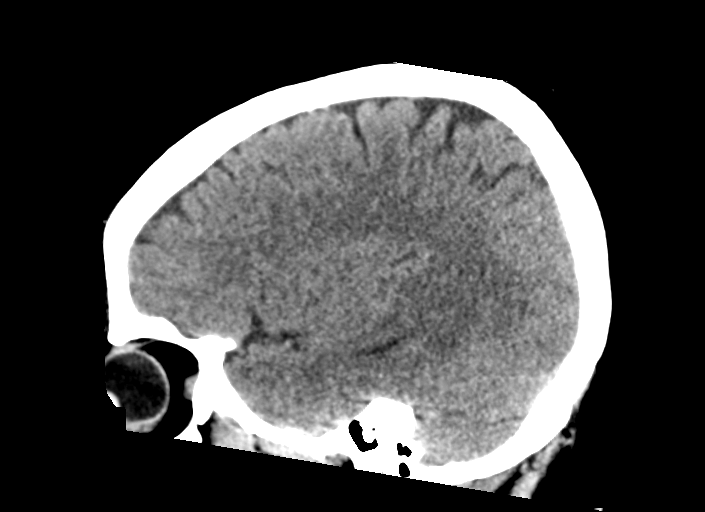
[im 34/67  brain]
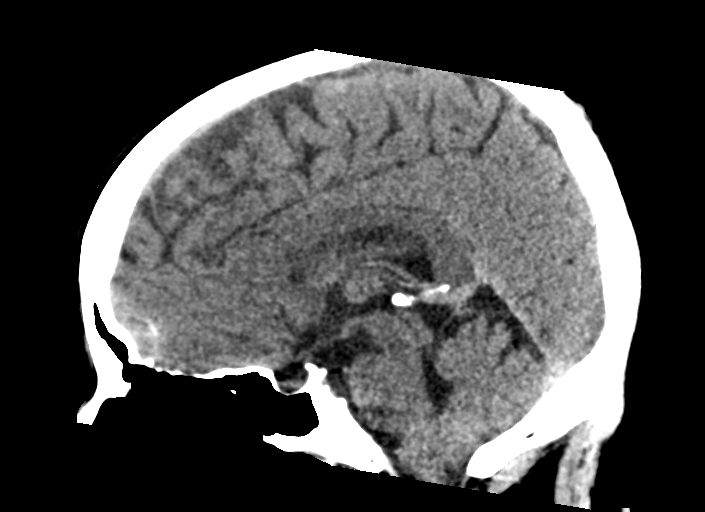
[im 45/67  brain]
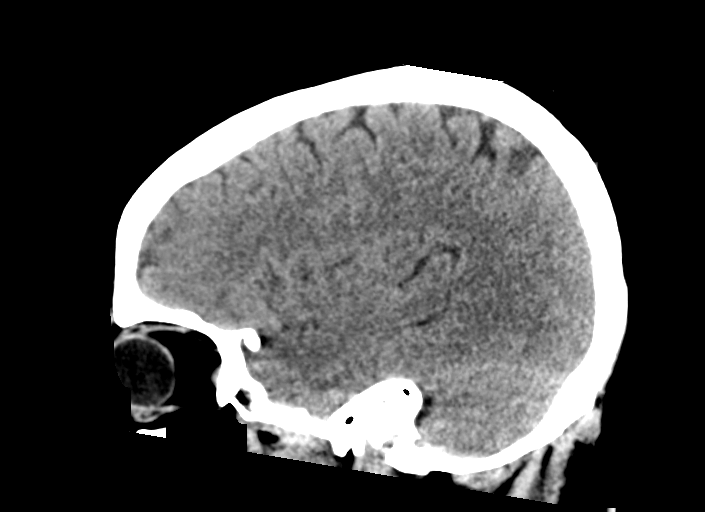

[15 of 47 positions shown; findings below may reference images not displayed]

FINDINGS: Brain: No evidence of acute infarction, hemorrhage, hydrocephalus,
extra-axial collection or mass lesion/mass effect.

The posterior fossa, including the cerebellum, brainstem and fourth
ventricle, is within normal limits. The third and lateral
ventricles, and basal ganglia are unremarkable in appearance. The
cerebral hemispheres are symmetric in appearance, with normal
gray-white differentiation. No mass effect or midline shift is seen.

Vascular: No hyperdense vessel or unexpected calcification.

Skull: There is no evidence of fracture; visualized osseous
structures are unremarkable in appearance.

Sinuses/Orbits: The orbits are within normal limits. The paranasal
sinuses and mastoid air cells are well-aerated.

Other: No significant soft tissue abnormalities are seen.
IMPRESSION: Unremarkable noncontrast CT of the head. No evidence of traumatic
intracranial injury or fracture.
